# Patient Record
Sex: Male | Born: 1970 | Race: White | Hispanic: No | Smoking: Never smoker
Health system: Southern US, Community
[De-identification: ages and names within clinical notes are randomized; demographics above are authoritative.]

## PROBLEM LIST (undated history)

## (undated) DIAGNOSIS — E785 Hyperlipidemia, unspecified: Secondary | ICD-10-CM

## (undated) DIAGNOSIS — G4730 Sleep apnea, unspecified: Secondary | ICD-10-CM

## (undated) DIAGNOSIS — I10 Essential (primary) hypertension: Secondary | ICD-10-CM

## (undated) DIAGNOSIS — F988 Other specified behavioral and emotional disorders with onset usually occurring in childhood and adolescence: Secondary | ICD-10-CM

## (undated) HISTORY — DX: Other specified behavioral and emotional disorders with onset usually occurring in childhood and adolescence: F98.8

## (undated) HISTORY — DX: Sleep apnea, unspecified: G47.30

## (undated) HISTORY — DX: Hyperlipidemia, unspecified: E78.5

## (undated) HISTORY — DX: Essential (primary) hypertension: I10

---

## 2009-03-03 ENCOUNTER — Ambulatory Visit: Payer: Self-pay | Admitting: Family Medicine

## 2009-03-03 DIAGNOSIS — I10 Essential (primary) hypertension: Secondary | ICD-10-CM | POA: Insufficient documentation

## 2012-08-19 ENCOUNTER — Other Ambulatory Visit: Payer: Self-pay | Admitting: Family Medicine

## 2012-08-19 DIAGNOSIS — R609 Edema, unspecified: Secondary | ICD-10-CM

## 2012-12-09 ENCOUNTER — Other Ambulatory Visit (HOSPITAL_COMMUNITY): Payer: Self-pay | Admitting: Urology

## 2012-12-09 DIAGNOSIS — N452 Orchitis: Secondary | ICD-10-CM

## 2013-01-30 ENCOUNTER — Ambulatory Visit (HOSPITAL_COMMUNITY): Payer: BC Managed Care – PPO

## 2016-02-01 DIAGNOSIS — G4733 Obstructive sleep apnea (adult) (pediatric): Secondary | ICD-10-CM | POA: Diagnosis not present

## 2016-02-19 ENCOUNTER — Other Ambulatory Visit (INDEPENDENT_AMBULATORY_CARE_PROVIDER_SITE_OTHER): Payer: Self-pay | Admitting: Orthopaedic Surgery

## 2016-02-28 ENCOUNTER — Emergency Department: Payer: Self-pay

## 2016-02-28 ENCOUNTER — Emergency Department
Admission: EM | Admit: 2016-02-28 | Discharge: 2016-02-28 | Disposition: A | Discharge status: Home / Self Care | Payer: BLUE CROSS/BLUE SHIELD | Source: Home / Self Care | Attending: Family Medicine | Admitting: Family Medicine

## 2016-02-28 ENCOUNTER — Other Ambulatory Visit (INDEPENDENT_AMBULATORY_CARE_PROVIDER_SITE_OTHER): Payer: Self-pay | Admitting: Orthopaedic Surgery

## 2016-02-28 ENCOUNTER — Encounter: Payer: Self-pay | Admitting: *Deleted

## 2016-02-28 DIAGNOSIS — L03116 Cellulitis of left lower limb: Secondary | ICD-10-CM | POA: Diagnosis not present

## 2016-02-28 MED ORDER — CLINDAMYCIN HCL 300 MG PO CAPS: 300.0000 mg | ORAL_CAPSULE | Freq: Three times a day (TID) | ORAL | 0 refills | Status: AC

## 2016-02-28 NOTE — ED Provider Notes (Signed)
[facility]    CSN: [id number] Arrival date & time: 02/28/16  [id number]     History   Chief Complaint Chief Complaint  Patient presents with  . Leg Pain    HPI Cody Bradley is a 45 y.o. male.   Patient reports that he bumped his left shin on furniture about one week ago.  He notes that his lower legs are normally swollen, but during the past 4 days he has developed increased swelling of his left lower leg with redness and warmth.  No fevers, chills, and sweats.   No chest pain or shortness of breath.  No past history of DVT.   The history is provided by the patient.  Leg Pain  Location:  Leg Time since incident:  1 week Injury: yes   Mechanism of injury comment:  Contusion Leg location:  R leg and L lower leg Pain details:    Quality:  Aching   Radiates to:  Does not radiate   Severity:  Moderate   Onset quality:  Sudden   Duration:  1 week   Timing:  Constant   Progression:  Worsening Chronicity:  New Prior injury to area:  No Relieved by:  Nothing Worsened by:  Bearing weight Ineffective treatments:  NSAIDs Associated symptoms: swelling   Associated symptoms: no back pain, no decreased ROM, no fatigue, no fever, no itching, no muscle weakness, no numbness, no stiffness and no tingling   Risk factors: obesity     Past Medical History:  Diagnosis Date  . ADD (attention deficit disorder)   . Hyperlipidemia   . Hypertension   . Sleep apnea     Patient Active Problem List   Diagnosis Date Noted  . HYPERTENSION 03/03/2009    History reviewed. No pertinent surgical history.     Home Medications    Prior to Admission medications   Medication Sig Start Date End Date Taking? Authorizing Provider  amLODipine-benazepril (LOTREL) 10-40 MG per capsule Take 1 capsule by mouth daily.   Yes Historical Provider, MD  glucosamine-chondroitin 500-400 MG tablet Take 1 tablet by mouth 3 (three) times daily.   Yes Historical Provider, MD    hydrochlorothiazide (HYDRODIURIL) 25 MG tablet Take 25 mg by mouth daily.   Yes Historical Provider, MD  metoprolol succinate (TOPROL-XL) 100 MG 24 hr tablet Take 100 mg by mouth daily. 1.5 PILLS DAILY. Take with or immediately following a meal.   Yes Historical Provider, MD  pravastatin (PRAVACHOL) 20 MG tablet Take 20 mg by mouth daily.   Yes Historical Provider, MD  clindamycin (CLEOCIN) 300 MG capsule Take 1 capsule (300 mg total) by mouth 3 (three) times daily. 02/28/16   Lattie HawStephen A Aizah Gehlhausen, MD  doxycycline (ORACEA) 40 MG capsule Take 40 mg by mouth every morning.    Historical Provider, MD  meloxicam (MOBIC) 15 MG tablet Take 15 mg by mouth daily.    Historical Provider, MD    Family History Family History  Problem Relation Age of Onset  . Hypertension Father   . Diabetes Mellitus I Father   . Diabetes Mellitus I Paternal Grandfather     Social History Social History  Substance Use Topics  . Smoking status: Never Smoker  . Smokeless tobacco: Never Used  . Alcohol use No     Allergies   Codeine and Tetanus toxoids   Review of Systems Review of Systems  Constitutional: Negative for chills, diaphoresis, fatigue and fever.  Respiratory: Negative for cough, chest tightness, shortness of  breath, wheezing and stridor.   Cardiovascular: Positive for leg swelling. Negative for chest pain and palpitations.  Musculoskeletal: Negative for back pain and stiffness.  Skin: Negative for itching.  All other systems reviewed and are negative.    Physical Exam Triage Vital Signs ED Triage Vitals  Enc Vitals Group     BP 02/28/16 0958 178/83     Pulse Rate 02/28/16 0958 115     Resp 02/28/16 0958 18     Temp 02/28/16 0958 98.6 F (37 C)     Temp Source 02/28/16 0958 Oral     SpO2 02/28/16 0958 96 %     Weight 02/28/16 0959 (!) 453 lb (205.5 kg)     Height 02/28/16 0959 5\' 8"  (1.727 m)     Head Circumference --      Peak Flow --      Pain Score 02/28/16 1001 5     Pain Loc --       Pain Edu? --      Excl. in GC? --    No data found.   Updated Vital Signs BP 178/83 (BP Location: Left Arm)   Pulse 115   Temp 98.6 F (37 C) (Oral)   Resp 18   Ht 5\' 8"  (1.727 m)   Wt (!) 453 lb (205.5 kg)   SpO2 96%   BMI 68.88 kg/m   Visual Acuity Right Eye Distance:   Left Eye Distance:   Bilateral Distance:    Right Eye Near:   Left Eye Near:    Bilateral Near:     Physical Exam  Constitutional: He appears well-developed and well-nourished. No distress.  HENT:  Head: Normocephalic.  Mouth/Throat: Oropharynx is clear and moist.  Eyes: Conjunctivae are normal. Pupils are equal, round, and reactive to light.  Neck: Neck supple.  Cardiovascular: Normal heart sounds.   Pulmonary/Chest: Breath sounds normal.  Abdominal: There is no tenderness.  Musculoskeletal: He exhibits edema.       Left lower leg: He exhibits tenderness, swelling and edema. He exhibits no bony tenderness.       Legs: Both lower legs have 3+ pitting edema.  The left lower leg inferior to the knee is diffusely erythematous, mildly tender to palpation, and slightly warm to touch.  Distal cap refill is present.  Lymphadenopathy:    He has no cervical adenopathy.  Neurological: He is alert.  Skin: Skin is warm and dry.  Nursing note and vitals reviewed.    UC Treatments / Results  Labs (all labs ordered are listed, but only abnormal results are displayed) Labs Reviewed - No data to display  EKG  EKG Interpretation None       Radiology [place] Venous Img Lower Unilateral Left  Result Date: 02/28/2016 CLINICAL DATA:  LT lower leg contusion after hitting leg on chair x 1 week ago. Increased Erythema, Swelling and pain the past 4 days. Hx HTN, Morbid obesity. EXAM: LEFT LOWER EXTREMITY VENOUS DOPPLER ULTRASOUND TECHNIQUE: Gray-scale sonography with compression, as well as color and duplex ultrasound, were performed to evaluate the deep venous system from the level of the common femoral vein  through the popliteal and proximal calf veins. Technologist describes technically difficult scan secondary to edema and body habitus. COMPARISON:  None FINDINGS: Normal compressibility of the common femoral, superficial femoral, and popliteal veins, as well as the proximal calf veins. No filling defects to suggest DVT on grayscale or color Doppler imaging. Doppler waveforms show normal direction of venous flow, normal  respiratory phasicity and response to augmentation. Visualized segments of the saphenous venous system normal in caliber and compressibility. Survey views of the contralateral common femoral vein are unremarkable. IMPRESSION: No evidence of  lower extremity deep vein thrombosis, left. Electronically Signed   By: Corlis Leak M.D.   On: 02/28/2016 11:54    Procedures Procedures (including critical care time)  Medications Ordered in UC Medications - No data to display   Initial Impression / Assessment and Plan / UC Course  I have reviewed the triage vital signs and the nursing notes.  Pertinent labs & imaging results that were available during my care of the patient were reviewed by me and considered in my medical decision making (see chart for details).  Clinical Course   No evidence DVT. Begin Clindamycin 300mg  TID Elevate leg as much as possible.  Apply heating pad 3 to 4 times daily.  May take Tylenol as needed for pain. If symptoms become significantly worse during the night or over the weekend, proceed to the local emergency room.  Followup with Family Doctor if not improved in one week.     Final Clinical Impressions(s) / UC Diagnoses   Final diagnoses:  Cellulitis of left lower leg    New Prescriptions New Prescriptions   CLINDAMYCIN (CLEOCIN) 300 MG CAPSULE    Take 1 capsule (300 mg total) by mouth 3 (three) times daily.     Lattie Haw, MD 03/11/16 [phone]

## 2016-02-28 NOTE — ED Triage Notes (Signed)
Pt c/o LT lower leg redness, swelling and pain x 4 days. He reports hitting his leg on a chair at home 1 week ago. He took IBF 400mg  at 0800 today.

## 2016-02-28 NOTE — Discharge Instructions (Signed)
Elevate leg as much as possible.  Apply heating pad 3 to 4 times daily.  May take Tylenol as needed for pain. If symptoms become significantly worse during the night or over the weekend, proceed to the local emergency room.

## 2016-03-01 ENCOUNTER — Telehealth: Payer: Self-pay

## 2016-03-01 NOTE — Telephone Encounter (Signed)
Pt stated that redness, swelling and soreness have resolved.  Will call PCP or UC if questions or problems.

## 2016-03-12 ENCOUNTER — Other Ambulatory Visit (INDEPENDENT_AMBULATORY_CARE_PROVIDER_SITE_OTHER): Payer: Self-pay | Admitting: Orthopaedic Surgery

## 2016-03-16 DIAGNOSIS — E119 Type 2 diabetes mellitus without complications: Secondary | ICD-10-CM | POA: Diagnosis not present

## 2016-03-16 DIAGNOSIS — I10 Essential (primary) hypertension: Secondary | ICD-10-CM | POA: Diagnosis not present

## 2016-03-16 DIAGNOSIS — E7800 Pure hypercholesterolemia, unspecified: Secondary | ICD-10-CM | POA: Diagnosis not present

## 2016-03-16 DIAGNOSIS — M170 Bilateral primary osteoarthritis of knee: Secondary | ICD-10-CM | POA: Diagnosis not present

## 2016-03-16 DIAGNOSIS — G4730 Sleep apnea, unspecified: Secondary | ICD-10-CM | POA: Diagnosis not present

## 2016-04-17 ENCOUNTER — Other Ambulatory Visit (INDEPENDENT_AMBULATORY_CARE_PROVIDER_SITE_OTHER): Payer: Self-pay | Admitting: Orthopaedic Surgery

## 2016-09-13 DIAGNOSIS — G4733 Obstructive sleep apnea (adult) (pediatric): Secondary | ICD-10-CM | POA: Diagnosis not present

## 2016-11-08 DIAGNOSIS — E119 Type 2 diabetes mellitus without complications: Secondary | ICD-10-CM | POA: Diagnosis not present

## 2016-11-08 DIAGNOSIS — E7800 Pure hypercholesterolemia, unspecified: Secondary | ICD-10-CM | POA: Diagnosis not present

## 2016-11-08 DIAGNOSIS — N390 Urinary tract infection, site not specified: Secondary | ICD-10-CM | POA: Diagnosis not present

## 2016-11-08 DIAGNOSIS — M170 Bilateral primary osteoarthritis of knee: Secondary | ICD-10-CM | POA: Diagnosis not present

## 2016-11-08 DIAGNOSIS — Z23 Encounter for immunization: Secondary | ICD-10-CM | POA: Diagnosis not present

## 2016-11-08 DIAGNOSIS — I10 Essential (primary) hypertension: Secondary | ICD-10-CM | POA: Diagnosis not present

## 2017-07-04 IMAGING — US US EXTREM LOW VENOUS*L*
1 series · 14 of 24 positions shown · non-contrast
Comparison: None

CLINICAL DATA: LT lower leg contusion after hitting leg on chair x
1 week ago. Increased Erythema, Swelling and pain the past 4 days.
Hx HTN, Morbid obesity.

EXAM:
LEFT LOWER EXTREMITY VENOUS DOPPLER ULTRASOUND
TECHNIQUE: Gray-scale sonography with compression, as well as color and duplex
ultrasound, were performed to evaluate the deep venous system from
the level of the common femoral vein through the popliteal and
proximal calf veins. Technologist describes technically difficult
scan secondary to edema and body habitus.

[Series 1: us extrem low venous*left* · 0.11mm/px · 14 of 29 slices shown]
[im 1/29]
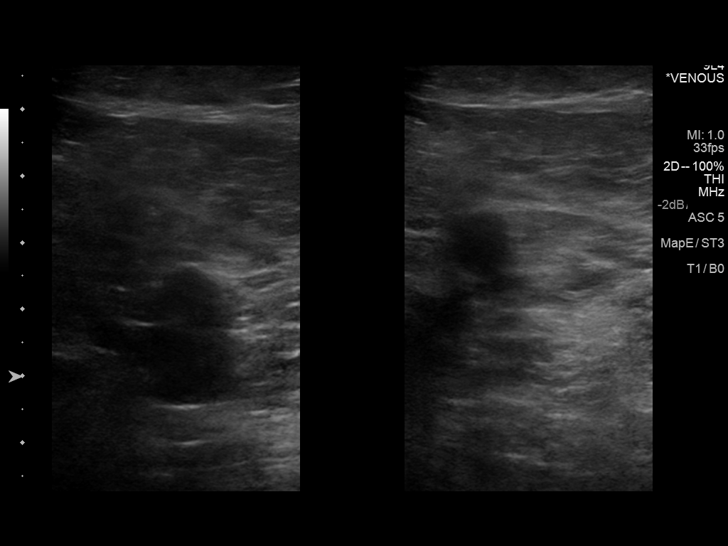
[im 3/29]
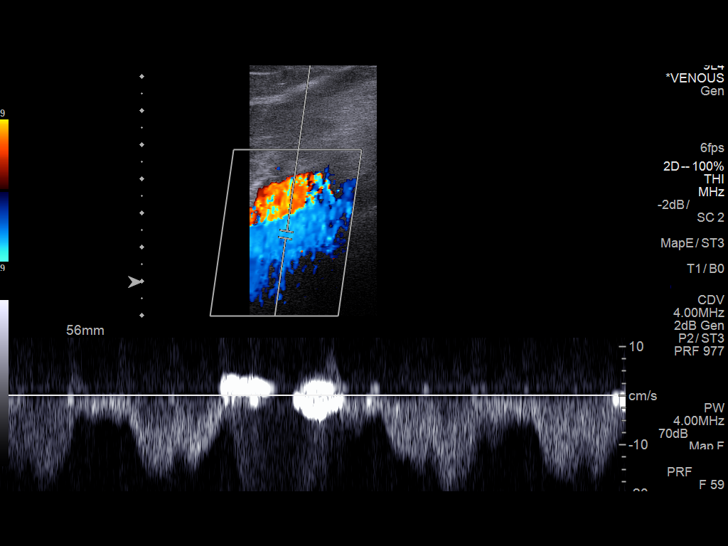
[im 5/29]
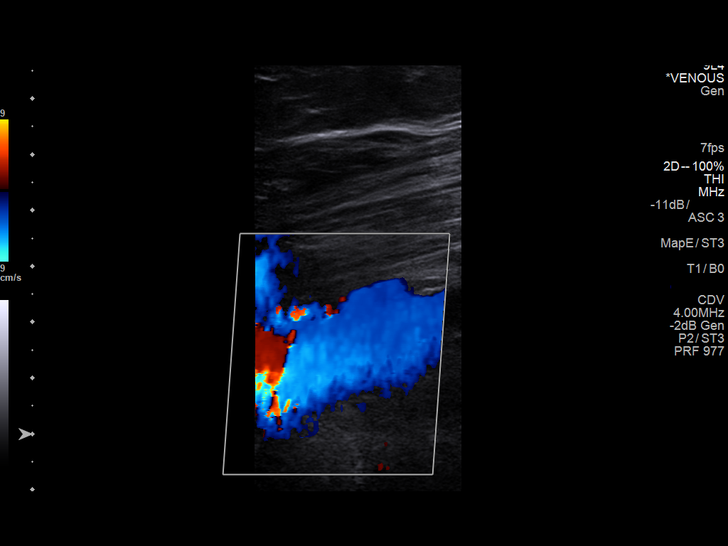
[im 8/29]
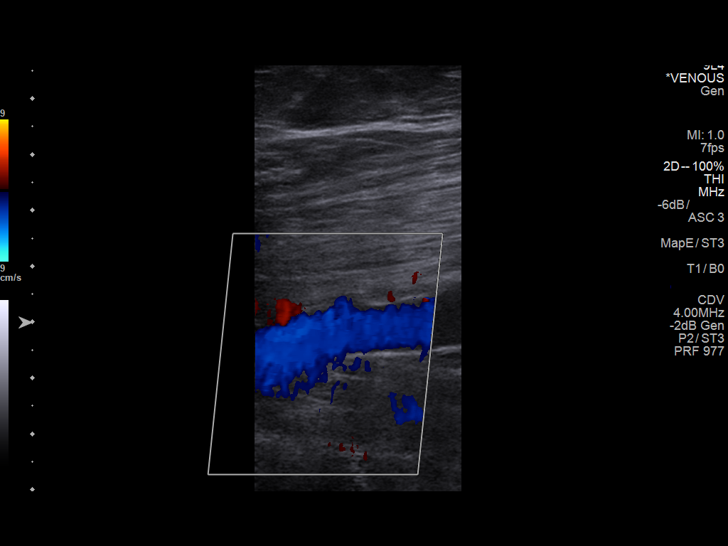
[im 9/29]
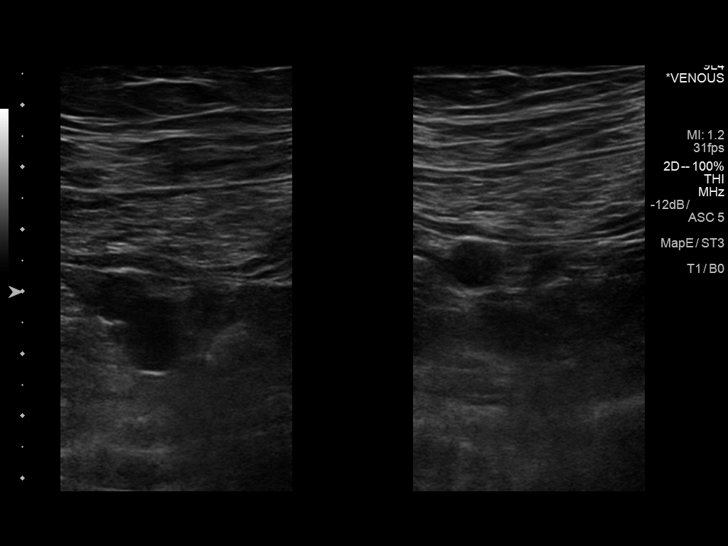
[im 11/29]
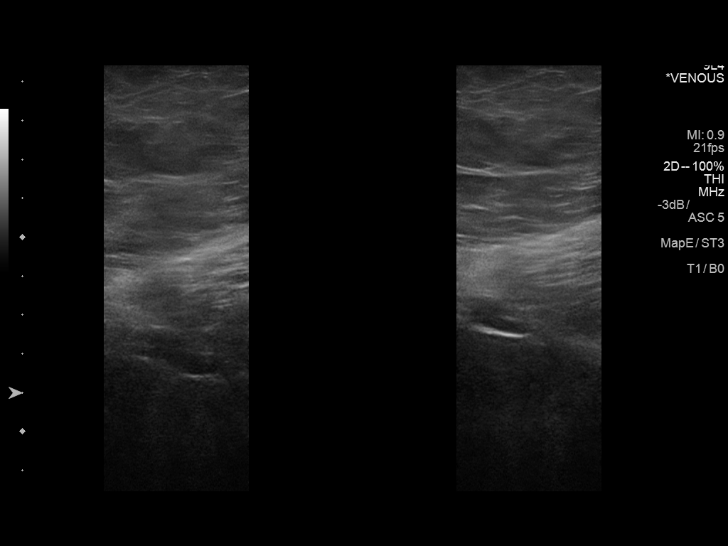
[im 14/29]
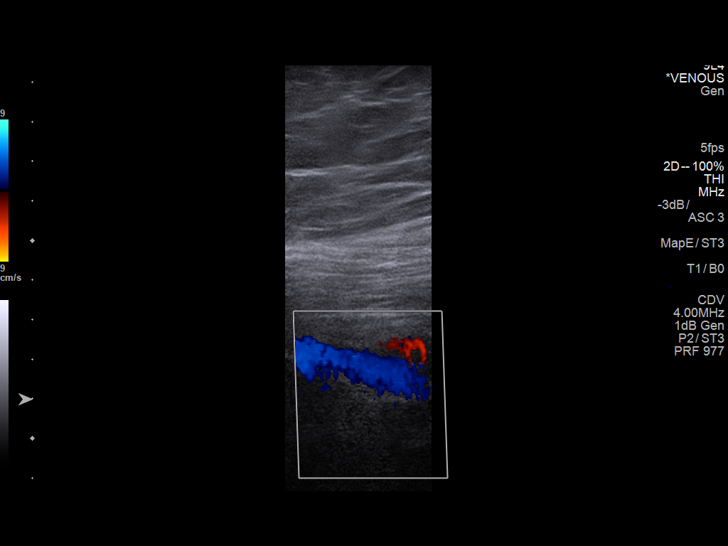
[im 15/29]
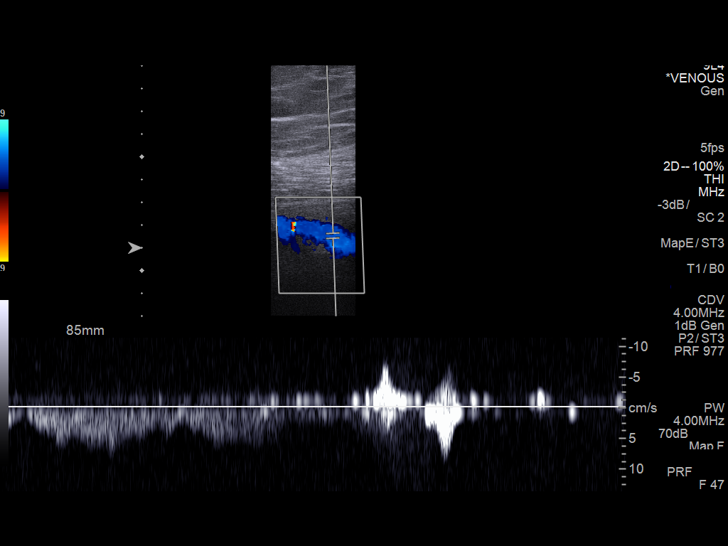
[im 18/29]
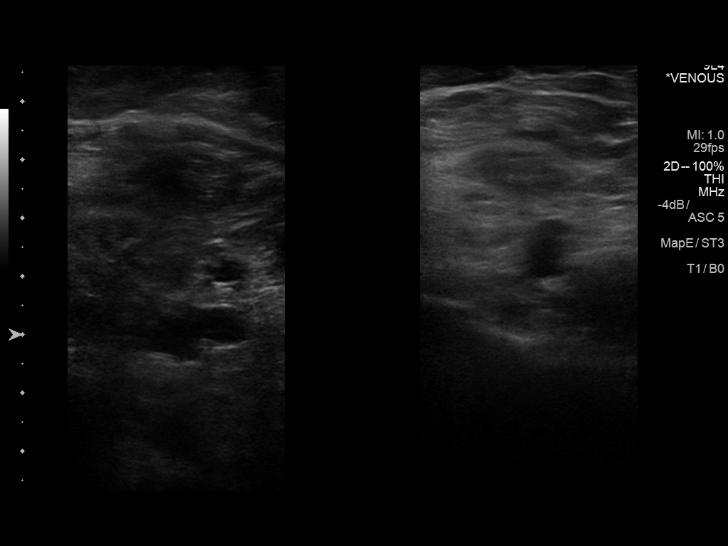
[im 20/29]
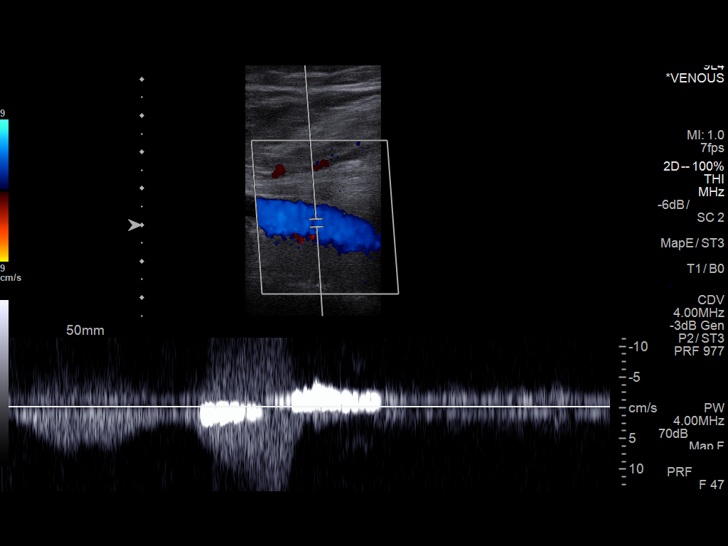
[im 22/29]
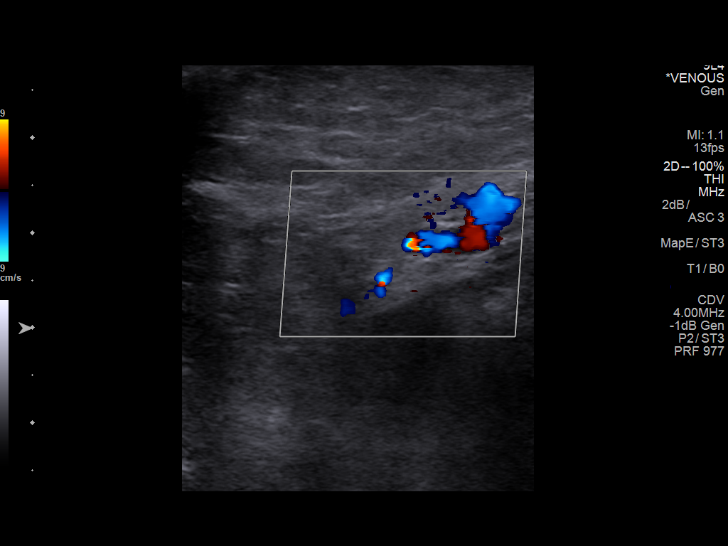
[im 24/29]
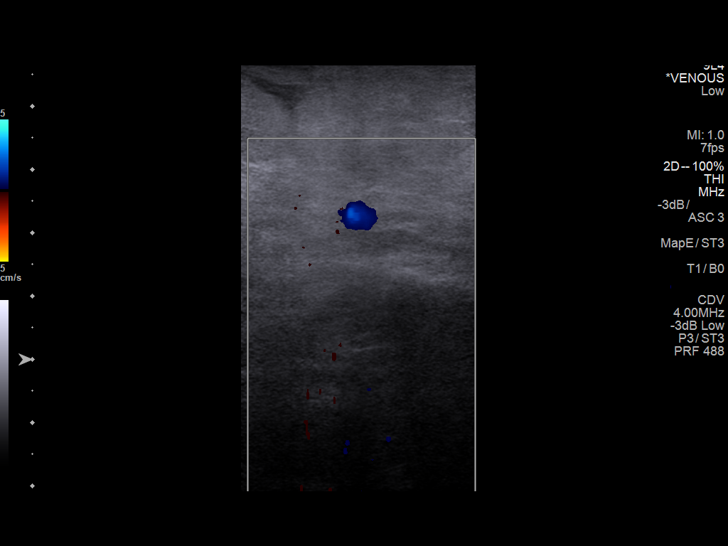
[im 26/29]
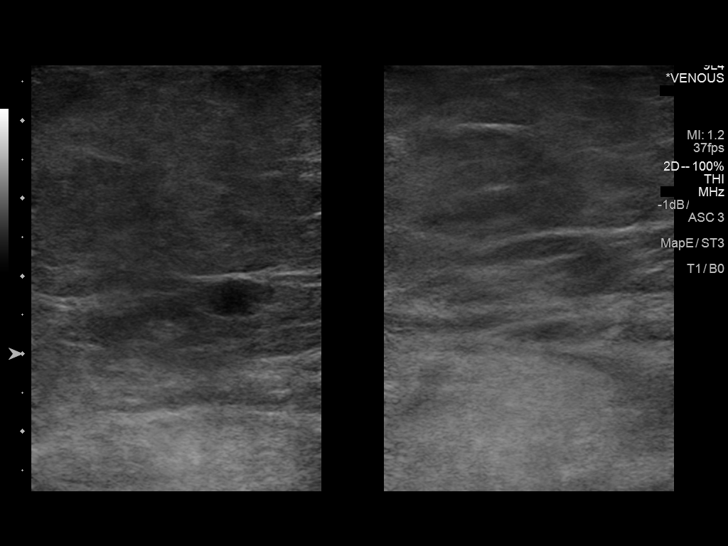
[im 29/29]
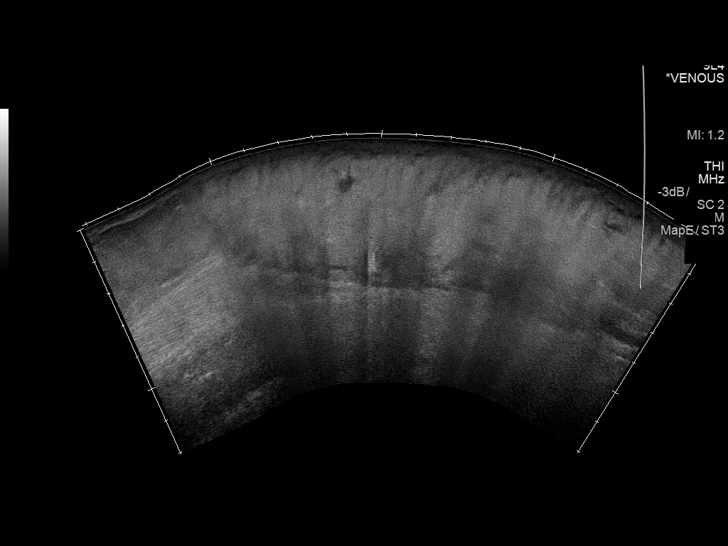

[14 of 24 positions shown; findings below may reference images not displayed]

FINDINGS: Normal compressibility of the common femoral, superficial femoral,
and popliteal veins, as well as the proximal calf veins. No filling
defects to suggest DVT on grayscale or color Doppler imaging.
Doppler waveforms show normal direction of venous flow, normal
respiratory phasicity and response to augmentation. Visualized
segments of the saphenous venous system normal in caliber and
compressibility. Survey views of the contralateral common femoral
vein are unremarkable.
IMPRESSION: No evidence of  lower extremity deep vein thrombosis, left.

## 2018-01-29 DIAGNOSIS — E7800 Pure hypercholesterolemia, unspecified: Secondary | ICD-10-CM | POA: Diagnosis not present

## 2018-01-29 DIAGNOSIS — E119 Type 2 diabetes mellitus without complications: Secondary | ICD-10-CM | POA: Diagnosis not present

## 2018-01-29 DIAGNOSIS — I10 Essential (primary) hypertension: Secondary | ICD-10-CM | POA: Diagnosis not present

## 2018-01-29 DIAGNOSIS — Z125 Encounter for screening for malignant neoplasm of prostate: Secondary | ICD-10-CM | POA: Diagnosis not present

## 2018-01-29 DIAGNOSIS — Z23 Encounter for immunization: Secondary | ICD-10-CM | POA: Diagnosis not present

## 2018-01-29 DIAGNOSIS — M170 Bilateral primary osteoarthritis of knee: Secondary | ICD-10-CM | POA: Diagnosis not present

## 2018-05-16 DIAGNOSIS — G4733 Obstructive sleep apnea (adult) (pediatric): Secondary | ICD-10-CM | POA: Diagnosis not present

## 2018-05-16 DIAGNOSIS — G4730 Sleep apnea, unspecified: Secondary | ICD-10-CM | POA: Diagnosis not present

## 2019-03-16 DIAGNOSIS — E119 Type 2 diabetes mellitus without complications: Secondary | ICD-10-CM | POA: Diagnosis not present

## 2019-03-16 DIAGNOSIS — I10 Essential (primary) hypertension: Secondary | ICD-10-CM | POA: Diagnosis not present

## 2019-03-16 DIAGNOSIS — E7800 Pure hypercholesterolemia, unspecified: Secondary | ICD-10-CM | POA: Diagnosis not present

## 2019-03-16 DIAGNOSIS — M170 Bilateral primary osteoarthritis of knee: Secondary | ICD-10-CM | POA: Diagnosis not present

## 2019-11-05 DIAGNOSIS — Z03818 Encounter for observation for suspected exposure to other biological agents ruled out: Secondary | ICD-10-CM | POA: Diagnosis not present

## 2021-09-09 DEATH — deceased
